# Patient Record
Sex: Female | Born: 1960
Health system: Southern US, Community
[De-identification: ages and names within clinical notes are randomized; demographics above are authoritative.]

## PROBLEM LIST (undated history)

## (undated) HISTORY — PX: ABDOMINAL HYSTERECTOMY: SUR658

---

## 2015-08-26 ENCOUNTER — Other Ambulatory Visit: Payer: Self-pay

## 2015-08-26 ENCOUNTER — Telehealth: Payer: Self-pay

## 2015-08-26 ENCOUNTER — Encounter: Payer: Self-pay | Admitting: Neurology

## 2015-08-26 ENCOUNTER — Ambulatory Visit (INDEPENDENT_AMBULATORY_CARE_PROVIDER_SITE_OTHER): Payer: BLUE CROSS/BLUE SHIELD | Admitting: Neurology

## 2015-08-26 VITALS — BP 122/70 | HR 85 | Ht 62.0 in | Wt 123.0 lb

## 2015-08-26 DIAGNOSIS — R569 Unspecified convulsions: Secondary | ICD-10-CM | POA: Diagnosis not present

## 2015-08-26 NOTE — Patient Instructions (Signed)
The pseudoseizures you described do not sound like epileptic seizures, so I am certain those are not seizures.  The twitching does not appear to be seizures either.  However, I would like to hook you up to an EEG to measure your brain waves and capture a twitch to see if the EEG shows seizure activity.  If it doesn't, then I would stay that the twitching is related to stress as well.  Appropriate therapy would require therapy with a psychologist as well as antidepressant medications.  We will contact you with EEG results and whether follow up is necessary.  Plan is to do a routine EEG followed up a 24 hour ambulatory EEG ([facility] just the 24 hour ambulatory EEG).

## 2015-08-26 NOTE — Progress Notes (Signed)
NEUROLOGY CONSULTATION NOTE  Alexandra Owens MRN: [id number] DOB: Sep 05, 1960  Referring provider: Dr. Yetta Flock Primary care provider: Dr. Yetta Flock  Reason for consult:  pseudoseizures  HISTORY OF PRESENT ILLNESS: Alexandra Owens is a 55 year old right-handed female with anxiety and established diagnosis of pseudoseizures who presents for muscle twitching.  History obtained by patient and PCP note.  In January, she developed significant work-related stress.  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  When she started, she unexpectedly learned that her division was understaffed, which caused significant stress.  She started to experience spells in which she develops generalized convulsions and flails but without loss of consciousness or awareness.  It lasts 10 to 15 minutes.  There is no incontinence or tongue biting.  There is no postictal confusion.  It was observed by her PCP who diagnosed her with pseudoseizures and she was started onTopamax  twice daily and Lexapro  daily.  These spells stopped in February.  In March, she began experiencing body jerks.  When she lays in bed, it involves the entire body.  If she is sitting up, it involves the shoulder.  It lasts just a second and occurs several times a day.  She has since resigned from her job due to the stress.  She has no history of seizures, head trauma, meningitis or complicated birth.  There is no family history of seizures.  PAST MEDICAL HISTORY: No past medical history on file.  PAST SURGICAL HISTORY: Past Surgical History  Procedure Laterality Date  . Abdominal hysterectomy      MEDICATIONS: No current outpatient prescriptions on file prior to visit.   No current facility-administered medications on file prior to visit.    ALLERGIES: Allergies  Allergen Reactions  . Sulfa Antibiotics Hives    FAMILY HISTORY: No family history on file.  SOCIAL HISTORY: Social History   Social History  . Marital  Status: Unknown    Spouse Name: N/A  . Number of Children: N/A  . Years of Education: N/A   Occupational History  . Not on file.   Social History Main Topics  . Smoking status: Never Smoker   . Smokeless tobacco: Not on file  . Alcohol Use: Not on file  . Drug Use: Not on file  . Sexual Activity: Not on file   Other Topics Concern  . Not on file   Social History Narrative  . No narrative on file    REVIEW OF SYSTEMS: Constitutional: No fevers, chills, or sweats, no generalized fatigue, change in appetite Eyes: No visual changes, double vision, eye pain Ear, nose and throat: No hearing loss, ear pain, nasal congestion, sore throat Cardiovascular: No chest pain, palpitations Respiratory:  No shortness of breath at rest or with exertion, wheezes GastrointestinaI: No nausea, vomiting, diarrhea, abdominal pain, fecal incontinence Genitourinary:  No dysuria, urinary retention or frequency Musculoskeletal:  No neck pain, back pain Integumentary: No rash, pruritus, skin lesions Neurological: as above Psychiatric: No depression, insomnia, anxiety Endocrine: No palpitations, fatigue, diaphoresis, mood swings, change in appetite, change in weight, increased thirst Hematologic/Lymphatic:  No anemia, purpura, petechiae. Allergic/Immunologic: no itchy/runny eyes, nasal congestion, recent allergic reactions, rashes  PHYSICAL EXAM: Filed Vitals:   08/26/15 0914  BP: 122/70  Pulse: 85   General: No acute distress.  Patient appears well-groomed.  Head:  Normocephalic/atraumatic Eyes:  fundi examined but not visualized Neck: supple, no paraspinal tenderness, full range of motion Back: No paraspinal tenderness Heart: regular rate and rhythm Lungs:  Clear to auscultation bilaterally. Vascular: No carotid bruits. Neurological Exam: Mental status: alert and oriented to person, place, and time, recent and remote memory intact, fund of knowledge intact, attention and concentration intact,  speech fluent and not dysarthric, language intact. Cranial nerves: CN I: not tested CN II: pupils equal, round and reactive to light, visual fields intact CN III, IV, VI:  full range of motion, no nystagmus, no ptosis CN V: facial sensation intact CN VII: upper and lower face symmetric CN VIII: hearing intact CN IX, X: gag intact, uvula midline CN XI: sternocleidomastoid and trapezius muscles intact CN XII: tongue midline Bulk & Tone: normal, no fasciculations. Motor:  5/5 throughout  Sensation:  temperature and vibration sensation intact. Deep Tendon Reflexes:  2+ throughout, toes downgoing.  Finger to nose testing:  Without dysmetria.  Heel to shin:  Without dysmetria.  Gait:  Normal station and stride.  Able to turn and tandem walk. Romberg negative. Her "jerks" were personally observed.  IMPRESSION: Non-epileptic spells.  Based on semiology, the "pseudoseizures" do sound non-epileptic.  The jerking spells, which were personally observed, also do not seem physiologic.  PLAN: 1.  I would like to obtain 24 hour ambulatory EEG to capture a spell.  If there is evidence of seizure activity, I would get an MRI of the brain and increase her topamax.  If there is no electrographic correlate, then management with psychology and psychiatry is recommended.  45 minutes spent face to face with patient, over 50% spent discussing diagnosis and plan.  Thank you for allowing me to take part in the care of this patient.  Shon MilletAdam Efosa Treichler, DO  CC:  Abner GreenspanBeth Hodges, MD

## 2015-08-26 NOTE — Progress Notes (Signed)
Chart forwarded.  

## 2015-08-26 NOTE — Telephone Encounter (Signed)
Orders placed for EEG and staff message sent to have pt schedule.

## 2015-08-27 ENCOUNTER — Ambulatory Visit (INDEPENDENT_AMBULATORY_CARE_PROVIDER_SITE_OTHER): Payer: BLUE CROSS/BLUE SHIELD | Admitting: Neurology

## 2015-08-27 DIAGNOSIS — R569 Unspecified convulsions: Secondary | ICD-10-CM | POA: Diagnosis not present

## 2015-08-28 ENCOUNTER — Telehealth: Payer: Self-pay | Admitting: Neurology

## 2015-08-28 ENCOUNTER — Telehealth: Payer: Self-pay

## 2015-08-28 DIAGNOSIS — IMO0001 Reserved for inherently not codable concepts without codable children: Secondary | ICD-10-CM

## 2015-08-28 NOTE — Telephone Encounter (Signed)
-----   Message from Alexandra DallasAdam R Jaffe, DO sent at 08/28/2015  7:10 AM EDT ----- EEG normal.  I want to order 24 hour ambulatory EEG to try and capture a jerking episode.

## 2015-08-28 NOTE — Procedures (Signed)
ELECTROENCEPHALOGRAM REPORT  Date of Study: 08/27/2015  Patient's Name: Alexandra Owens MRN: [id number] Date of Birth: Jul 01, 1960  Indication: 55 year old female with presumed pseudoseizures, experiencing intermittent body jerks.  Medications: Topiramate Escitalopram   Technical Summary: This is a multichannel digital EEG recording, using the international 10-20 placement system with electrodes applied with paste and impedances below 5000 ohms.    Description: The EEG background is symmetric, with a well-developed posterior dominant rhythm of 10-11 Hz, which is reactive to eye opening and closing.  Diffuse beta activity is seen, with a bilateral frontal preponderance.  No focal or generalized abnormalities are seen.  No focal or generalized epileptiform discharges are seen.  Stage II sleep is seen, with normal and symmetric sleep patterns.  Hyperventilation and photic stimulation were performed, and produced no abnormalities.  ECG revealed normal cardiac rate and rhythm.  Impression: This is a normal routine EEG of the awake and asleep states, with activating procedures.  A normal study does not rule out the possibility of a seizure disorder in this patient.  Boyce Keltner R. Everlena CooperJaffe, DO

## 2015-08-28 NOTE — Telephone Encounter (Signed)
Chart forwarded.  

## 2015-08-28 NOTE — Telephone Encounter (Signed)
Alexandra Owens Jan 02, 2061. Her PCP office (Dr. Yetta FlockHodges) called needing notes faxed from her appointment on 08/27/15 and from her upcoming appointment on 09/03/15. The fax number is [phone]. Thank you

## 2015-08-28 NOTE — Telephone Encounter (Signed)
Pt aware. She has a amb EEG appointment next week.

## 2015-09-03 ENCOUNTER — Ambulatory Visit (INDEPENDENT_AMBULATORY_CARE_PROVIDER_SITE_OTHER): Payer: BLUE CROSS/BLUE SHIELD | Admitting: Neurology

## 2015-09-03 DIAGNOSIS — R569 Unspecified convulsions: Secondary | ICD-10-CM

## 2015-09-04 ENCOUNTER — Telehealth: Payer: Self-pay

## 2015-09-04 NOTE — Telephone Encounter (Signed)
Message relayed to patient. Verbalized understanding and denied questions.   

## 2015-09-04 NOTE — Procedures (Signed)
24 HOUR AMBULATORY ELECTROENCEPHALOGRAM REPORT  Date of Study: 09/03/2015 to 09/04/2015  Patient's Name: Alexandra Owens MRN: [id number] Date of Birth: [birth year]  Indication: Body twitching  Medications: topiramate escitalopram  Technical Summary: This is a multichannel digital EEG recording, using the international 10-20 placement system with electrodes applied with paste and impedances below 5000 ohms.    Description: The EEG background is symmetric, with a well-developed posterior dominant rhythm of 12 Hz, which is reactive to eye opening and closing.  Diffuse beta activity is seen, with a bilateral frontal preponderance.  No focal or generalized abnormalities are seen.  No focal or generalized epileptiform discharges are seen.  The patient had several habitual spells with no electrographic correlate.  Stage II sleep is seen, with normal and symmetric sleep patterns.  Hyperventilation and photic stimulation were not perfomed.  ECG revealed normal cardiac rate and rhythm.  Impression: This is a normal 24 hour ambulatory EEG.  Several of the patient's habitual spells were captured without electrographic correlate, indicating that these are non-epileptic events.  Adam R. Everlena CooperJaffe, DO

## 2015-09-04 NOTE — Telephone Encounter (Signed)
-----   Message from Drema DallasAdam R Jaffe, DO sent at 09/04/2015 12:19 PM EDT ----- I reviewed the EEG and saw her twitches.  There is no correlating seizures.  The twitching is not neurologic.  It is likely a symptom of underlying stress

## 2017-04-30 DIAGNOSIS — J019 Acute sinusitis, unspecified: Secondary | ICD-10-CM | POA: Diagnosis not present

## 2017-12-15 DIAGNOSIS — Z131 Encounter for screening for diabetes mellitus: Secondary | ICD-10-CM | POA: Diagnosis not present

## 2017-12-15 DIAGNOSIS — Z13220 Encounter for screening for lipoid disorders: Secondary | ICD-10-CM | POA: Diagnosis not present

## 2017-12-15 DIAGNOSIS — Z136 Encounter for screening for cardiovascular disorders: Secondary | ICD-10-CM | POA: Diagnosis not present

## 2017-12-15 DIAGNOSIS — Z0130 Encounter for examination of blood pressure without abnormal findings: Secondary | ICD-10-CM | POA: Diagnosis not present

## 2017-12-15 DIAGNOSIS — Z713 Dietary counseling and surveillance: Secondary | ICD-10-CM | POA: Diagnosis not present

## 2018-02-02 DIAGNOSIS — Z1239 Encounter for other screening for malignant neoplasm of breast: Secondary | ICD-10-CM | POA: Diagnosis not present

## 2018-02-02 DIAGNOSIS — Z1231 Encounter for screening mammogram for malignant neoplasm of breast: Secondary | ICD-10-CM | POA: Diagnosis not present

## 2018-05-05 ENCOUNTER — Encounter (HOSPITAL_COMMUNITY): Payer: Self-pay | Admitting: *Deleted

## 2018-05-05 ENCOUNTER — Emergency Department (HOSPITAL_COMMUNITY)
Admission: EM | Admit: 2018-05-05 | Discharge: 2018-05-05 | Disposition: A | Discharge status: Home / Self Care | Payer: Commercial Managed Care - PPO | Attending: Emergency Medicine | Admitting: Emergency Medicine

## 2018-05-05 ENCOUNTER — Other Ambulatory Visit: Payer: Self-pay

## 2018-05-05 ENCOUNTER — Emergency Department (HOSPITAL_COMMUNITY): Payer: Commercial Managed Care - PPO

## 2018-05-05 DIAGNOSIS — R109 Unspecified abdominal pain: Secondary | ICD-10-CM

## 2018-05-05 DIAGNOSIS — K5792 Diverticulitis of intestine, part unspecified, without perforation or abscess without bleeding: Secondary | ICD-10-CM | POA: Diagnosis not present

## 2018-05-05 DIAGNOSIS — R1011 Right upper quadrant pain: Secondary | ICD-10-CM | POA: Diagnosis present

## 2018-05-05 DIAGNOSIS — K5732 Diverticulitis of large intestine without perforation or abscess without bleeding: Secondary | ICD-10-CM | POA: Insufficient documentation

## 2018-05-05 LAB — URINALYSIS, ROUTINE W REFLEX MICROSCOPIC
Bacteria, UA: NONE SEEN
Bilirubin Urine: NEGATIVE
Glucose, UA: NEGATIVE mg/dL
Ketones, ur: NEGATIVE mg/dL
Leukocytes, UA: NEGATIVE
Nitrite: NEGATIVE
PROTEIN: NEGATIVE mg/dL
SPECIFIC GRAVITY, URINE: 1.006 (ref 1.005–1.030)
pH: 6 (ref 5.0–8.0)

## 2018-05-05 MED ORDER — AMOXICILLIN-POT CLAVULANATE 875-125 MG PO TABS
1.0000 | ORAL_TABLET | Freq: Once | ORAL | Status: AC
  Administered 2018-05-05: 1 via ORAL
  Filled 2018-05-05: qty 1

## 2018-05-05 MED ORDER — AMOXICILLIN-POT CLAVULANATE 875-125 MG PO TABS: 1.0000 | ORAL_TABLET | Freq: Two times a day (BID) | ORAL | 0 refills | Status: AC

## 2018-05-05 NOTE — ED Triage Notes (Addendum)
Pt stated "I woke up with pain in my side but it's been going on for a few days but not all the time. Nausea just started."  Pt indicates pain to right flank.  Denies dysuria.

## 2018-05-05 NOTE — ED Provider Notes (Signed)
[facility]-EMERGENCY DEPT Provider Note: Lowella Dell, MD, FACEP  CSN: [id number] MRN: [id number] ARRIVAL: 05/05/18 at 0423 ROOM: [facility]   CHIEF COMPLAINT  Flank Pain (right)   HISTORY OF PRESENT ILLNESS  05/05/18 4:46 AM Alexandra Owens is a 58 y.o. female with a 2-day history of pain in her right flank.  She describes the pain as a pressing pain.  There seems to be some exacerbation with movement and position.  She rates it as a 5 out of 10 currently.  The pain has been waxing and waning.  She has had some nausea with it but no vomiting.  She has had no urinary or bowel changes.  She is also had some right upper quadrant pain about 45 minutes after eating but this seems unrelated to the flank pain.   History reviewed. No pertinent past medical history.  Past Surgical History:  Procedure Laterality Date  . ABDOMINAL HYSTERECTOMY      No family history on file.  Social History   Tobacco Use  . Smoking status: Never Smoker  Substance Use Topics  . Alcohol use: Yes    Alcohol/week: 0.0 standard drinks    Comment: rarely  . Drug use: Never    Prior to Admission medications   Not on File    Allergies Sulfa antibiotics   REVIEW OF SYSTEMS  Negative except as noted here or in the History of Present Illness.   PHYSICAL EXAMINATION  Initial Vital Signs Blood pressure (!) 146/98, pulse 86, temperature 98.3 F (36.8 C), temperature source Oral, resp. rate 14, height 5\' 1"  (1.549 m), weight 72.6 kg, SpO2 96 %.  Examination General: Well-developed, well-nourished female in no acute distress; appearance consistent with age of record HENT: normocephalic; atraumatic Eyes: pupils equal, round and reactive to light; extraocular muscles intact Neck: supple Heart: regular rate and rhythm Lungs: clear to auscultation bilaterally Abdomen: soft; nondistended; mild left lower quadrant tenderness; no masses or hepatosplenomegaly; bowel sounds present GU: Mild right CVA  tenderness Extremities: No deformity; full range of motion; pulses normal Neurologic: Awake, alert and oriented; motor function intact in all extremities and symmetric; no facial droop Skin: Warm and dry Psychiatric: Normal mood and affect   RESULTS  Summary of this visit's results, reviewed by myself:   EKG Interpretation  Date/Time:    Ventricular Rate:    PR Interval:    QRS Duration:   QT Interval:    QTC Calculation:   R Axis:     Text Interpretation:        Laboratory Studies: Results for orders placed or performed during the hospital encounter of 05/05/18 (from the past 24 hour(s))  Urinalysis, Routine w reflex microscopic- may I&O cath if menses     Status: Abnormal   Collection Time: 05/05/18  4:43 AM  Result Value Ref Range   Color, Urine STRAW (A) YELLOW   APPearance CLEAR CLEAR   Specific Gravity, Urine 1.006 1.005 - 1.030   pH 6.0 5.0 - 8.0   Glucose, UA NEGATIVE NEGATIVE mg/dL   Hgb urine dipstick SMALL (A) NEGATIVE   Bilirubin Urine NEGATIVE NEGATIVE   Ketones, ur NEGATIVE NEGATIVE mg/dL   Protein, ur NEGATIVE NEGATIVE mg/dL   Nitrite NEGATIVE NEGATIVE   Leukocytes, UA NEGATIVE NEGATIVE   RBC / HPF 0-5 0 - 5 RBC/hpf   WBC, UA 0-5 0 - 5 WBC/hpf   Bacteria, UA NONE SEEN NONE SEEN   Squamous Epithelial / LPF 0-5 0 - 5   Imaging Studies:  Ct Renal Stone Study  Result Date: 05/05/2018 CLINICAL DATA:  Right flank pain with stone disease suspected EXAM: CT ABDOMEN AND PELVIS WITHOUT CONTRAST TECHNIQUE: Multidetector CT imaging of the abdomen and pelvis was performed following the standard protocol without IV contrast. COMPARISON:  None. FINDINGS: Lower chest:  No contributory findings. Hepatobiliary: Scattered hepatic cysts.No evidence of biliary obstruction or stone. Pancreas: Unremarkable. Spleen: Unremarkable. Adrenals/Urinary Tract: Negative adrenals. No hydronephrosis or stone. Unremarkable bladder. Stomach/Bowel: Mild stranding at the descending sigmoid  junction where there are numerous colonic diverticula. No abscess or pneumoperitoneum. No appendicitis. No bowel obstruction. Vascular/Lymphatic: No acute vascular abnormality. No mass or adenopathy. Reproductive:Hysterectomy Other: No ascites or pneumoperitoneum. Musculoskeletal: No acute abnormalities. IMPRESSION: 1. No hydronephrosis, urolithiasis, or appendicitis. No explanation for history of right-sided pain. 2. Mild diverticulitis at the descending sigmoid junction. Electronically Signed   By: Marnee Spring M.D.   On: 05/05/2018 05:58    ED COURSE and MDM  Nursing notes and initial vitals signs, including pulse oximetry, reviewed.  Vitals:   05/05/18 0433  BP: (!) 146/98  Pulse: 86  Resp: 14  Temp: 98.3 F (36.8 C)  TempSrc: Oral  SpO2: 96%  Weight: 72.6 kg  Height: 5\' 1"  (1.549 m)   6:05 AM We will treat patient for mild diverticulitis.  She declines analgesics.  I suspect her right flank pain is musculoskeletal or radicular in nature.  She was advised the right upper quadrant pain may represent gallbladder disease and she should follow-up with her primary care physician should symptoms persist.  PROCEDURES    ED DIAGNOSES     ICD-10-CM   1. Sigmoid diverticulitis K57.32   2. Right flank pain R10.9        Irene Mitcham, Jonny Ruiz, MD 05/05/18 [phone]

## 2018-07-05 ENCOUNTER — Emergency Department (HOSPITAL_BASED_OUTPATIENT_CLINIC_OR_DEPARTMENT_OTHER)
Admission: EM | Admit: 2018-07-05 | Discharge: 2018-07-05 | Disposition: A | Discharge status: Home / Self Care | Payer: Commercial Managed Care - PPO | Attending: Emergency Medicine | Admitting: Emergency Medicine

## 2018-07-05 ENCOUNTER — Encounter (HOSPITAL_BASED_OUTPATIENT_CLINIC_OR_DEPARTMENT_OTHER): Payer: Self-pay

## 2018-07-05 ENCOUNTER — Other Ambulatory Visit: Payer: Self-pay

## 2018-07-05 ENCOUNTER — Emergency Department (HOSPITAL_BASED_OUTPATIENT_CLINIC_OR_DEPARTMENT_OTHER): Payer: Commercial Managed Care - PPO

## 2018-07-05 DIAGNOSIS — R079 Chest pain, unspecified: Secondary | ICD-10-CM | POA: Insufficient documentation

## 2018-07-05 DIAGNOSIS — M79662 Pain in left lower leg: Secondary | ICD-10-CM | POA: Diagnosis present

## 2018-07-05 LAB — COMPREHENSIVE METABOLIC PANEL
ALT: 16 U/L (ref 0–44)
ANION GAP: 7 (ref 5–15)
AST: 20 U/L (ref 15–41)
Albumin: 4.4 g/dL (ref 3.5–5.0)
Alkaline Phosphatase: 58 U/L (ref 38–126)
BUN: 15 mg/dL (ref 6–20)
CO2: 27 mmol/L (ref 22–32)
Calcium: 9.1 mg/dL (ref 8.9–10.3)
Chloride: 104 mmol/L (ref 98–111)
Creatinine, Ser: 0.89 mg/dL (ref 0.44–1.00)
GFR calc Af Amer: >60 mL/min (ref >60)
GFR calc non Af Amer: >60 mL/min (ref >60)
Glucose, Bld: 95 mg/dL (ref 70–99)
Potassium: 3.7 mmol/L (ref 3.5–5.1)
Sodium: 138 mmol/L (ref 135–145)
Total Bilirubin: 0.6 mg/dL (ref 0.3–1.2)
Total Protein: 7.7 g/dL (ref 6.5–8.1)

## 2018-07-05 LAB — CBC WITH DIFFERENTIAL/PLATELET
Abs Immature Granulocytes: 0 10*3/uL (ref 0.00–0.07)
Basophils Absolute: 0 10*3/uL (ref 0.0–0.1)
Basophils Relative: 1 %
Eosinophils Absolute: 0.2 10*3/uL (ref 0.0–0.5)
Eosinophils Relative: 3 %
HCT: 40.8 % (ref 36.0–46.0)
Hemoglobin: 12.6 g/dL (ref 12.0–15.0)
Immature Granulocytes: 0 %
Lymphocytes Relative: 34 %
Lymphs Abs: 2.4 10*3/uL (ref 0.7–4.0)
MCH: 26.1 pg (ref 26.0–34.0)
MCHC: 30.9 g/dL (ref 30.0–36.0)
MCV: 84.6 fL (ref 80.0–100.0)
Monocytes Absolute: 0.5 10*3/uL (ref 0.1–1.0)
Monocytes Relative: 7 %
Neutro Abs: 3.9 10*3/uL (ref 1.7–7.7)
Neutrophils Relative %: 55 %
Platelets: 239 10*3/uL (ref 150–400)
RBC: 4.82 MIL/uL (ref 3.87–5.11)
RDW: 14.9 % (ref 11.5–15.5)
WBC: 6.9 10*3/uL (ref 4.0–10.5)
nRBC: 0 % (ref 0.0–0.2)

## 2018-07-05 LAB — D-DIMER, QUANTITATIVE: D-Dimer, Quant: 0.46 ug/mL-FEU (ref 0.00–0.50)

## 2018-07-05 LAB — TROPONIN I: Troponin I: <0.03 ng/mL (ref <0.03)

## 2018-07-05 MED ORDER — ACETAMINOPHEN 500 MG PO TABS
1000.0000 mg | ORAL_TABLET | Freq: Once | ORAL | Status: AC
  Administered 2018-07-05: 1000 mg via ORAL
  Filled 2018-07-05: qty 2

## 2018-07-05 MED ORDER — KETOROLAC TROMETHAMINE 15 MG/ML IJ SOLN
15.0000 mg | Freq: Once | INTRAMUSCULAR | Status: AC
  Administered 2018-07-05: 15 mg via INTRAVENOUS
  Filled 2018-07-05: qty 1

## 2018-07-05 NOTE — Discharge Instructions (Addendum)
Your ultrasound shows concern for possible ruptured Baker's cyst.  I would treat this symptomatically with cold compresses.  Make sure you are elevating the area.  Use the Ace wrap for compression.  Use the crutches for limited weightbearing in the next several days.  Have this reevaluated by your primary care doctor for repeat ultrasound if indicated.  In terms of your chest pain work-up today has been reassuring.  No findings to explain your chest pain.  May related to your acid reflux.  Again our work-up rules out emergency such as blood clot or heart attack however we do recommend you following up with your primary care doctor for further work-up for ongoing chest pain with possible stress test.  Return to ED if you develop any worsening symptoms or any signs of redness or drainage from your left leg, or fever

## 2018-07-05 NOTE — ED Notes (Signed)
ED Provider at bedside. 

## 2018-07-05 NOTE — ED Triage Notes (Signed)
Pt c/o left calf pain and tightness since last night with some swelling into left foot. Pt denies any recent travel, denies BCP use, denies smoking.

## 2018-07-05 NOTE — ED Provider Notes (Signed)
[facility] EMERGENCY DEPARTMENT Provider Note   CSN: [id number] Arrival date & time: 07/05/18  1635    History   Chief Complaint Chief Complaint  Patient presents with   Leg Pain    HPI Alexandra Owens is a 58 y.o. female.     HPI 58 year old female with no pertinent past medical history presents to the emergency department today for evaluation of left calf pain.  Patient states that last night she got a "severe cramp in her left calf".  She thought that it would go away however it has persisted.  She rates the pain a 9/10.  Worse with palpation and range of motion.  Able to ambulate but this does cause her pain.  She tried massaging without any relief.  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  Upon further questioning patient does endorse intermittent chest pain.  She describes it as dull ache at times across her anterior chest.  She states this will come and go.  Last for several seconds.  Not exertional.  Not pleuritic in nature.  No associated shortness of breath.  Ongoing for 2 to 3 months.  Saw her primary care doctor concerning this and states that her primary care doctor did not feel that she was having a heart attack and thought that may be related to acid reflux.  Patient denies any chest pain at this time.  No history of PE/DVT, prolonged immobilization, recent hospitalization/surgeries, unilateral leg swelling or calf tenderness, hemoptysis, hormone use.  Denies any tobacco use.  No family history of PEs or clotting disorders.  No family history of significant cardiac disease.  Patient denies any cardiac disease herself.  She takes no medications for her symptoms.  She did take ibuprofen yesterday for her pain with some relief.  Has not taken any medications today for pain. History reviewed. No pertinent past medical history.  There are no active problems to display for this  patient.   Past Surgical History:  Procedure Laterality Date   ABDOMINAL HYSTERECTOMY       OB History   No obstetric history on file.      Home Medications    Prior to Admission medications   Medication Sig Start Date End Date Taking? Authorizing Provider  amoxicillin-clavulanate (AUGMENTIN) 875-125 MG tablet Take 1 tablet by mouth 2 (two) times daily. One po bid x 7 days 05/05/18   Molpus, John, MD    Family History No family history on file.  Social History Social History   Tobacco Use   Smoking status: Never Smoker  Substance Use Topics   Alcohol use: Yes    Alcohol/week: 0.0 standard drinks    Comment: rarely   Drug use: Never     Allergies   Sulfa antibiotics   Review of Systems Review of Systems  Constitutional: Negative for chills and fever.  HENT: Negative for congestion.   Eyes: Negative for discharge.  Respiratory: Negative for cough, shortness of breath and wheezing.   Cardiovascular: Positive for chest pain and leg swelling. Negative for palpitations.  Gastrointestinal: Negative for abdominal pain, diarrhea, nausea and vomiting.  Genitourinary: Negative for hematuria.  Musculoskeletal: Negative for myalgias.  Skin: Negative for rash.  Neurological: Negative for headaches.     Physical Exam Updated Vital Signs BP 137/84 (BP Location: Left Arm)    Pulse 90    Temp 99 F (37.2 C) (  Oral)    Resp 16    Ht  (1.549 m)    Wt 72.6 kg    SpO2 98%    BMI 30.23 kg/m   Physical Exam Vitals signs and nursing note reviewed.  Constitutional:      General: She is not in acute distress.    Appearance: She is well-developed. She is not toxic-appearing or diaphoretic.  HENT:     Head: Normocephalic and atraumatic.     Nose: Nose normal.     Mouth/Throat:     Mouth: Mucous membranes are moist.  Eyes:     General:        Right eye: No discharge.        Left eye: No discharge.     Conjunctiva/sclera: Conjunctivae normal.     Pupils: Pupils  are equal, round, and reactive to light.  Neck:     Musculoskeletal: Normal range of motion and neck supple.     Vascular: No JVD.     Trachea: No tracheal deviation.  Cardiovascular:     Rate and Rhythm: Normal rate and regular rhythm.     Pulses: Normal pulses.     Heart sounds: Normal heart sounds. No murmur. No friction rub. No gallop.   Pulmonary:     Effort: Pulmonary effort is normal. No respiratory distress.     Breath sounds: Normal breath sounds. No stridor. No wheezing, rhonchi or rales.  Chest:     Chest wall: No tenderness.  Abdominal:     General: Bowel sounds are normal. There is no distension.     Palpations: Abdomen is soft.     Tenderness: There is no abdominal tenderness. There is no guarding or rebound.  Musculoskeletal: Normal range of motion.       Legs:     Comments: Minimal edema to the left calf.  Tender to palpation.  No palpable cord.  No erythema, induration, fluctuance noted.  Minimal warmth noted to the left calf.  Pain extends to the left popliteal fossa.  Skin compartments are soft.  DP pulses 2+ bilaterally.  Sensation intact.  Brisk cap refill.  Full range of motion of all joints the left lower leg.  Lymphadenopathy:     Cervical: No cervical adenopathy.  Skin:    General: Skin is warm and dry.     Capillary Refill: Capillary refill takes less than 2 seconds.  Neurological:     Mental Status: She is alert and oriented to person, place, and time.     Sensory: No sensory deficit.  Psychiatric:        Behavior: Behavior normal.        Thought Content: Thought content normal.        Judgment: Judgment normal.      ED Treatments / Results  Labs (all labs ordered are listed, but only abnormal results are displayed) Labs Reviewed  CBC WITH DIFFERENTIAL/PLATELET  COMPREHENSIVE METABOLIC PANEL  TROPONIN I  D-DIMER, QUANTITATIVE (NOT AT [facility])    EKG EKG Interpretation  Date/Time:  Wednesday July 05 2018 19:59:02 EDT Ventricular Rate:   76 PR Interval:    QRS Duration: 92 QT Interval:  392 QTC Calculation: 441 R Axis:   20 Text Interpretation:  Sinus rhythm Low voltage, precordial leads No old tracing to compare Confirmed by Linwood Dibbles [phone]) on 07/05/2018 8:16:08 PM   Radiology Dg Chest 2 View  Result Date: 07/05/2018 CLINICAL DATA:  Initial evaluation for acute chest pain. EXAM: CHEST - 2  VIEW COMPARISON:  None available. FINDINGS: The cardiac and mediastinal silhouettes are within normal limits. The lungs are normally inflated. No airspace consolidation, pleural effusion, or pulmonary edema is identified. There is no pneumothorax. No acute osseous abnormality identified. IMPRESSION: No ac radiographic evidence for active cardiopulmonary disease. Electronically Signed   By: Rise Mu M.D.   On: 07/05/2018 19:29   US Venous Img Lower Unilateral Left  Result Date: 07/05/2018 CLINICAL DATA:  Initial evaluation for acute left calf pain, swelling. EXAM: Left LOWER EXTREMITY VENOUS DOPPLER ULTRASOUND TECHNIQUE: Gray-scale sonography with graded compression, as well as color Doppler and duplex ultrasound were performed to evaluate the lower extremity deep venous systems from the level of the common femoral vein and including the common femoral, femoral, profunda femoral, popliteal and calf veins including the posterior tibial, peroneal and gastrocnemius veins when visible. The superficial great saphenous vein was also interrogated. Spectral Doppler was utilized to evaluate flow at rest and with distal augmentation maneuvers in the common femoral, femoral and popliteal veins. COMPARISON:  None. FINDINGS: Contralateral Common Femoral Vein: Respiratory phasicity is normal and symmetric with the symptomatic side. No evidence of thrombus. Normal compressibility. Common Femoral Vein: No evidence of thrombus. Normal compressibility, respiratory phasicity and response to augmentation. Saphenofemoral Junction: No evidence of thrombus.  Normal compressibility and flow on color Doppler imaging. Profunda Femoral Vein: No evidence of thrombus. Normal compressibility and flow on color Doppler imaging. Femoral Vein: No evidence of thrombus. Normal compressibility, respiratory phasicity and response to augmentation. Popliteal Vein: No evidence of thrombus. Normal compressibility, respiratory phasicity and response to augmentation. Calf Veins: No evidence of thrombus. Normal compressibility and flow on color Doppler imaging. Superficial Great Saphenous Vein: No evidence of thrombus. Normal compressibility. Venous Reflux:  None. Other Findings: 3.8 x 0.7 x 4.6 cm mildly complex cystic lesion at the posterior left calf at site of pain. Lesion positioned within the calf musculature no internal vascularity or definite solid component. IMPRESSION: 1. No evidence of deep venous thrombosis. 2. 3.8 x 0.7 x 4.6 cm mildly complex collection positioned at site of pain in the left calf. Finding is nonspecific, and could reflect sequelae of a recently ruptured popliteal/Baker's cyst. Prior trauma with subacute hematoma formation or infection with abscess formation could also be considered in the correct clinical setting. Electronically Signed   By: Rise Mu M.D.   On: 07/05/2018 19:35    Procedures Procedures (including critical care time)  Medications Ordered in ED Medications  acetaminophen (TYLENOL) tablet 1,000 mg (1,000 mg Oral Given 07/05/18 1754)     Initial Impression / Assessment and Plan / ED Course  I have reviewed the triage vital signs and the nursing notes.  Pertinent labs & imaging results that were available during my care of the patient were reviewed by me and considered in my medical decision making (see chart for details).        58 year old female presents to the ED for evaluation of left calf pain.  She also reports some intermittent chest pain that she is being seen by her primary care doctor for this.  Patient  overall well-appearing and nontoxic on exam.  Vital signs are very reassuring.  Patient neurovascular intact in lower extremities.  No focal neurological deficit.  Heart regular rate and rhythm.  Lungs clear to auscultation bilaterally.  Labs are overall reassuring.  No leukocytosis.  Hemoglobin at baseline.  No significant electrolyte derangement.  Normal d-dimer.  Negative troponin.  Patient's symptoms have been intermittent for the  past 3 months.  Denies any pain at this time.  Patient's hear score is 1.  Patient seems atypical for ACS.  Given negative d-dimer low suspicion for PE and no indication for CTA of chest.  EKG performed shows normal sinus rhythm with a heart rate of 76 bpm.  Patient has some nonspecific T wave inversions in V1 but no reciprocal changes or signs of acute ST elevation or depression.  Ultrasound shows no signs of DVT.  Does note concern for collection likely ruptured Baker's cyst.  Patient has no erythema, fluctuance or wound to the back of her leg.  Low suspicion for abscess formation.  Patient afebrile.  Likely ruptured Baker's cyst causing pain.  Will treat symptomatically with rice therapy, NSAIDs and minimal weightbearing.  Patient to follow-up with her primary care doctor.  In terms of the chest pain.  Unknown etiology however have ruled out emergent conditions in the ED today.  Patient to follow back up with her primary care doctor for further testing with stress test if indicated.  Pt is hemodynamically stable, in NAD, & able to ambulate in the ED. Evaluation does not show pathology that would require ongoing emergent intervention or inpatient treatment. I explained the diagnosis to the patient. Pain has been managed & has no complaints prior to dc. Pt is comfortable with above plan and is stable for discharge at this time. All questions were answered prior to disposition. Strict return precautions for f/u to the ED were discussed. Encouraged follow up with PCP.   Final  Clinical Impressions(s) / ED Diagnoses   Final diagnoses:  Pain of left calf  Chest pain, unspecified type    ED Discharge Orders    None       Wallace Keller 07/05/18 2018    Linwood Dibbles, MD 07/09/18 1112

## 2018-07-17 DIAGNOSIS — M79662 Pain in left lower leg: Secondary | ICD-10-CM | POA: Diagnosis not present

## 2019-06-23 ENCOUNTER — Ambulatory Visit: Payer: Commercial Managed Care - PPO | Attending: Internal Medicine

## 2019-06-23 DIAGNOSIS — Z23 Encounter for immunization: Secondary | ICD-10-CM | POA: Insufficient documentation

## 2019-06-23 NOTE — Progress Notes (Signed)
   Covid-19 Vaccination Clinic  Name:  Cyril Railey    MRN: [id number] DOB: 20-Mar-1961  06/23/2019  Ms. Tilmon was observed post Covid-19 immunization for 15 minutes without incidence. She was provided with Vaccine Information Sheet and instruction to access the V-Safe system.   Ms. Vokes was instructed to call 911 with any severe reactions post vaccine: Marland Kitchen Difficulty breathing  . Swelling of your face and throat  . A fast heartbeat  . A bad rash all over your body  . Dizziness and weakness    Immunizations Administered    Name Date Dose VIS Date Route   Pfizer COVID-19 Vaccine 06/23/2019  5:24 PM 0.3 mL 04/06/2019 Intramuscular   Manufacturer: [employer], [employer]   Lot: [id number]   NDC: [address]-5

## 2019-07-14 ENCOUNTER — Ambulatory Visit: Payer: Commercial Managed Care - PPO | Attending: Internal Medicine

## 2019-07-14 DIAGNOSIS — Z23 Encounter for immunization: Secondary | ICD-10-CM

## 2019-07-14 NOTE — Progress Notes (Signed)
   Covid-19 Vaccination Clinic  Name:  Alexandra Owens    MRN: [id number] DOB: 11/02/1960  07/14/2019  [name] was observed post Covid-19 immunization for 15 minutes without incident. She was provided with Vaccine Information Sheet and instruction to access the V-Safe system.   [name] was instructed to call 911 with any severe reactions post vaccine: Marland Kitchen Difficulty breathing  . Swelling of face and throat  . A fast heartbeat  . A bad rash all over body  . Dizziness and weakness   Immunizations Administered    Name Date Dose VIS Date Route   Pfizer COVID-19 Vaccine 07/14/2019  9:17 AM 0.3 mL 04/06/2019 Intramuscular   Manufacturer: [employer], [employer]   Lot: [id number]   NDC: [address]-4

## 2020-05-02 IMAGING — CR CHEST - 2 VIEW
2 series · 2 of 2 positions shown · non-contrast
Comparison: None available.

CLINICAL DATA: Initial evaluation for acute chest pain.

EXAM:
CHEST - 2 VIEW

[w chest pa]
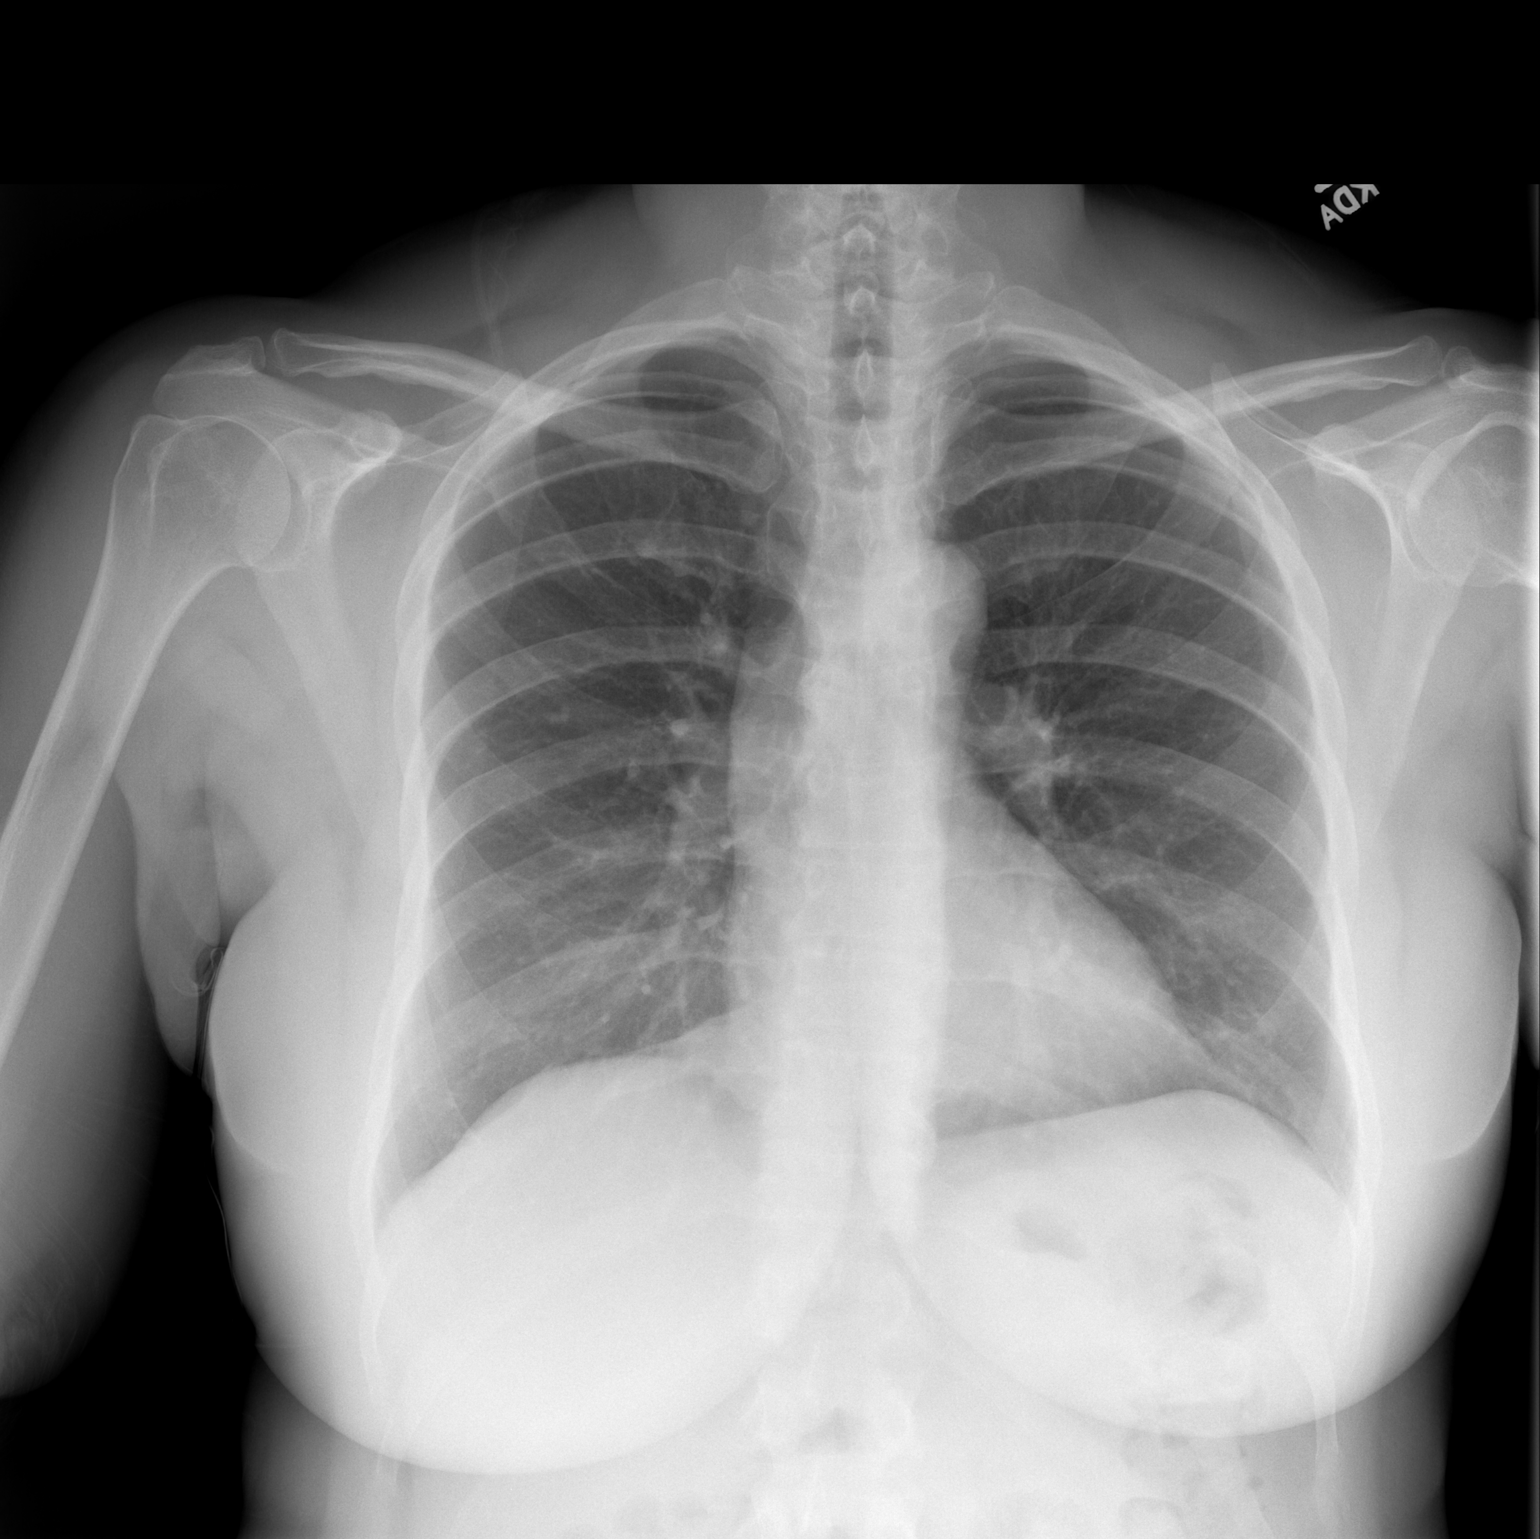

[w chest lat]
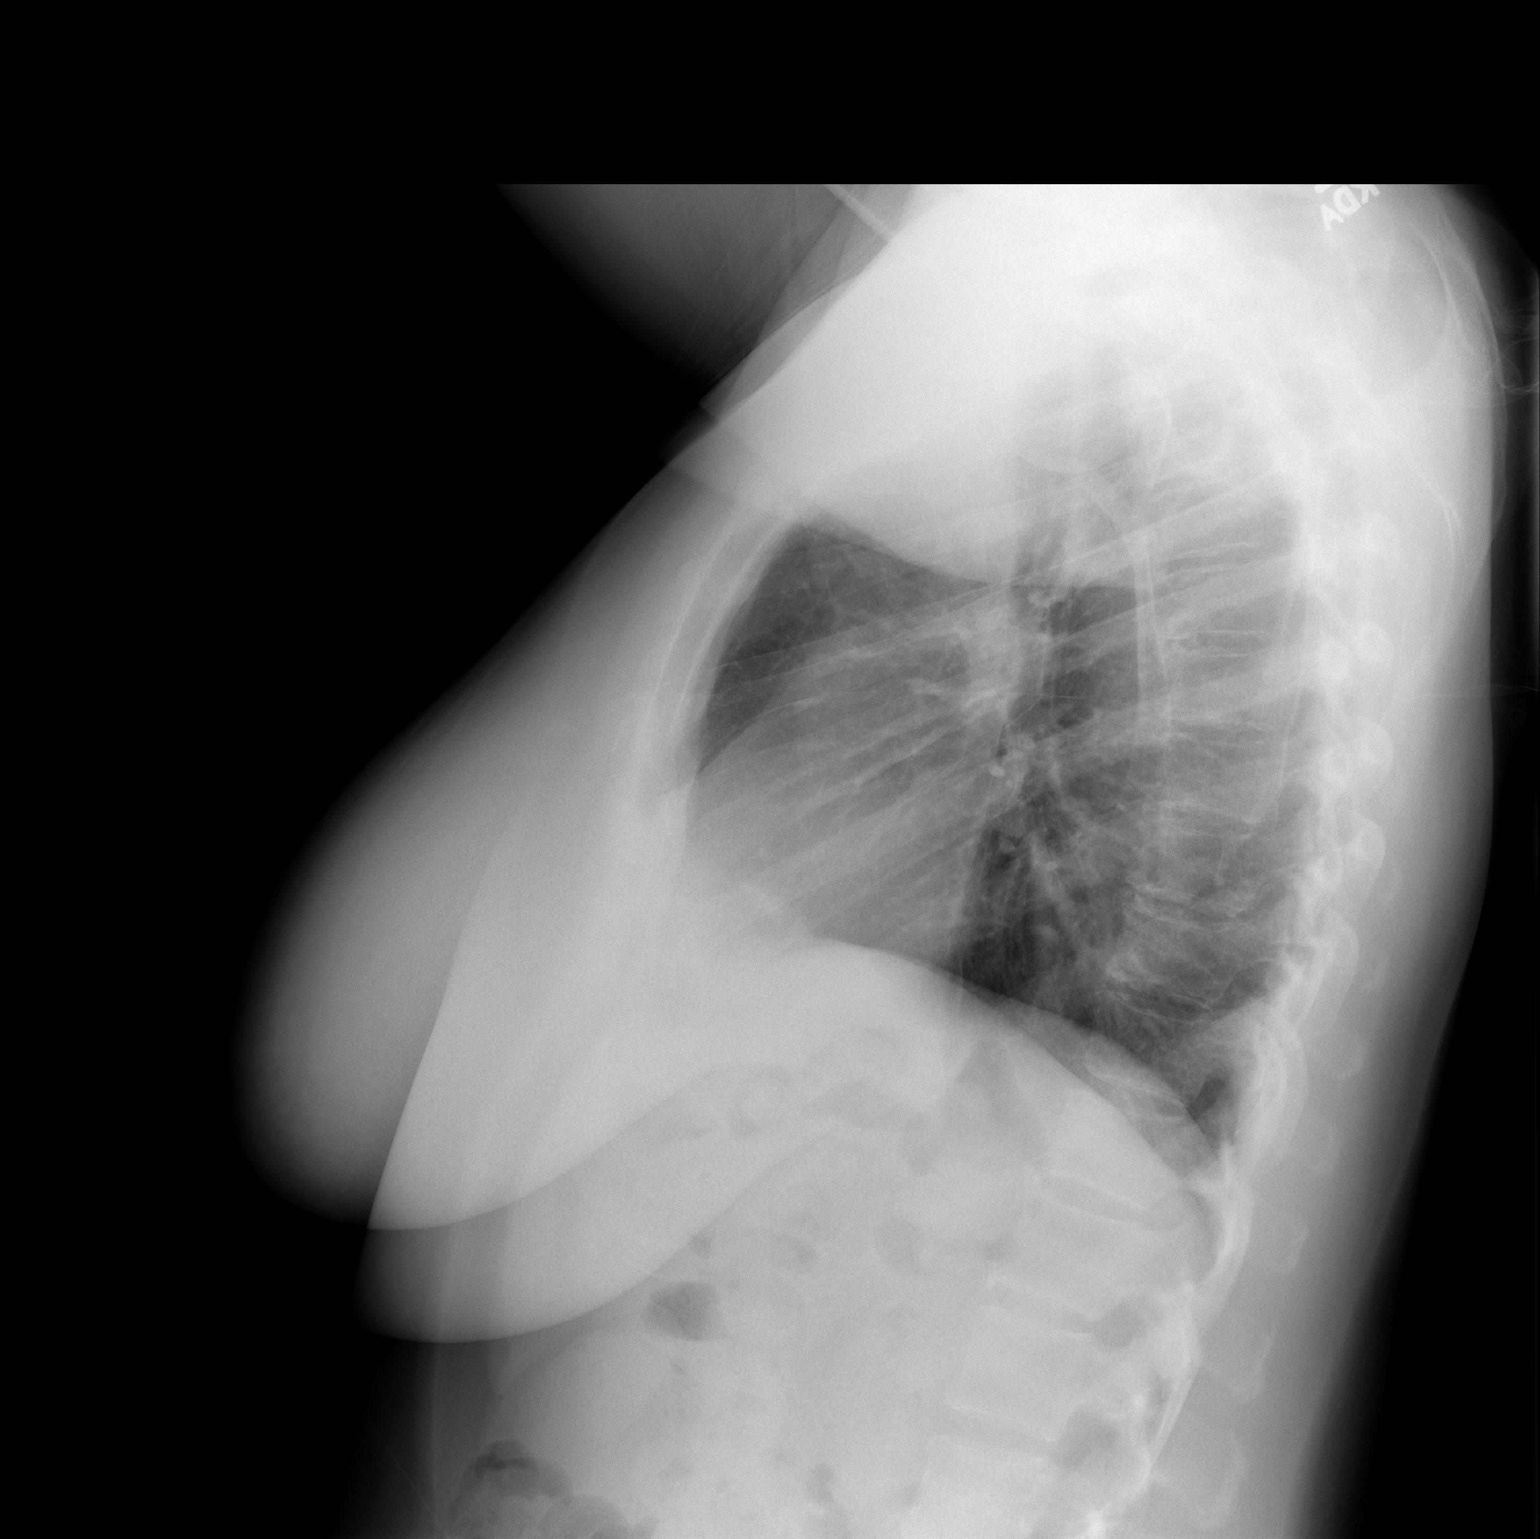

[2 of 2 positions shown; findings below may reference images not displayed]

FINDINGS: The cardiac and mediastinal silhouettes are within normal limits.

The lungs are normally inflated. No airspace consolidation, pleural
effusion, or pulmonary edema is identified. There is no
pneumothorax.

No acute osseous abnormality identified.
IMPRESSION: No ac radiographic evidence for active cardiopulmonary disease.

## 2021-03-26 ENCOUNTER — Emergency Department (HOSPITAL_COMMUNITY)
Admission: EM | Admit: 2021-03-26 | Discharge: 2021-03-26 | Disposition: A | Discharge status: Home / Self Care | Payer: Commercial Managed Care - PPO | Attending: Emergency Medicine | Admitting: Emergency Medicine

## 2021-03-26 ENCOUNTER — Other Ambulatory Visit: Payer: Self-pay

## 2021-03-26 ENCOUNTER — Emergency Department (HOSPITAL_COMMUNITY): Payer: Commercial Managed Care - PPO

## 2021-03-26 ENCOUNTER — Encounter (HOSPITAL_COMMUNITY): Payer: Self-pay

## 2021-03-26 DIAGNOSIS — R1011 Right upper quadrant pain: Secondary | ICD-10-CM | POA: Diagnosis not present

## 2021-03-26 LAB — COMPREHENSIVE METABOLIC PANEL
ALT: 16 U/L (ref 0–44)
AST: 16 U/L (ref 15–41)
Albumin: 4.4 g/dL (ref 3.5–5.0)
Alkaline Phosphatase: 54 U/L (ref 38–126)
Anion gap: 7 (ref 5–15)
BUN: 12 mg/dL (ref 6–20)
CO2: 26 mmol/L (ref 22–32)
Calcium: 9 mg/dL (ref 8.9–10.3)
Chloride: 105 mmol/L (ref 98–111)
Creatinine, Ser: 0.85 mg/dL (ref 0.44–1.00)
GFR, Estimated: >60 mL/min (ref >60)
Glucose, Bld: 103 mg/dL — ABNORMAL HIGH (ref 70–99)
Potassium: 4 mmol/L (ref 3.5–5.1)
Sodium: 138 mmol/L (ref 135–145)
Total Bilirubin: 0.7 mg/dL (ref 0.3–1.2)
Total Protein: 7.5 g/dL (ref 6.5–8.1)

## 2021-03-26 LAB — URINALYSIS, ROUTINE W REFLEX MICROSCOPIC
Bilirubin Urine: NEGATIVE
Glucose, UA: NEGATIVE mg/dL
Hgb urine dipstick: NEGATIVE
Ketones, ur: NEGATIVE mg/dL
Leukocytes,Ua: NEGATIVE
Nitrite: NEGATIVE
Protein, ur: NEGATIVE mg/dL
Specific Gravity, Urine: 1.014 (ref 1.005–1.030)
pH: 7 (ref 5.0–8.0)

## 2021-03-26 LAB — LIPASE, BLOOD: Lipase: 30 U/L (ref 11–51)

## 2021-03-26 LAB — CBC
HCT: 40.9 % (ref 36.0–46.0)
Hemoglobin: 12.9 g/dL (ref 12.0–15.0)
MCH: 26.2 pg (ref 26.0–34.0)
MCHC: 31.5 g/dL (ref 30.0–36.0)
MCV: 83.1 fL (ref 80.0–100.0)
Platelets: 235 10*3/uL (ref 150–400)
RBC: 4.92 MIL/uL (ref 3.87–5.11)
RDW: 14.8 % (ref 11.5–15.5)
WBC: 4.1 10*3/uL (ref 4.0–10.5)
nRBC: 0 % (ref 0.0–0.2)

## 2021-03-26 MED ORDER — IBUPROFEN 200 MG PO TABS: 200.0000 mg | ORAL_TABLET | Freq: Four times a day (QID) | ORAL | Status: DC | PRN

## 2021-03-26 MED ORDER — ONDANSETRON HCL 4 MG PO TABS
4.0000 mg | ORAL_TABLET | ORAL | Status: DC | PRN
  Filled 2021-03-26: qty 1

## 2021-03-26 MED ORDER — SUCRALFATE 1 G PO TABS: 1.0000 g | ORAL_TABLET | Freq: Three times a day (TID) | ORAL | 1 refills | Status: AC

## 2021-03-26 NOTE — ED Provider Notes (Addendum)
[facility]-EMERGENCY DEPT Provider Note   CSN: [id number] Arrival date & time: 03/26/21  5638    History Chief Complaint  Patient presents with   Abdominal Pain    Alexandra Owens is a 60 y.o. female who presents to the ED today with complaints of RUQ/epigastric abdominal pain.  She is unsure exactly when all of her symptoms started, however she noticed worsening RUQ pain over the past several days to week.  The pain first started after eating foods, but now she states that the pain is constant and feels like a "pressing" pain.  The pain does not radiate anywhere however she does describe feeling a shooting pain to the right shoulder and down her right leg since this all started.  She states that she felt nauseous this morning, but she has not vomited.  States that she may be constipated and has been having some smaller-formed stools.  Has not eaten recently, describes feeling "full." She has not taken Tylenol or ibuprofen at home.  Of note, patient was seen in the ED 2 years ago and was found to have diverticulitis.    History reviewed. No pertinent past medical history.  There are no problems to display for this patient.   Past Surgical History:  Procedure Laterality Date   ABDOMINAL HYSTERECTOMY       OB History   No obstetric history on file.     History reviewed. No pertinent family history.  Social History   Tobacco Use   Smoking status: Never  Substance Use Topics   Alcohol use: Yes    Alcohol/week: 0.0 standard drinks    Comment: rarely   Drug use: Never    Home Medications Prior to Admission medications   Medication Sig Start Date End Date Taking? Authorizing Provider  sucralfate (CARAFATE) 1 g tablet Take 1 tablet (1 g total) by mouth 4 (four) times daily -  with meals and at bedtime. 03/26/21  Yes Andrey Campanile, MD  amoxicillin-clavulanate (AUGMENTIN) 875-125 MG tablet Take 1 tablet by mouth 2 (two) times daily. One po bid x 7 days  05/05/18   Molpus, John, MD    Allergies    Sulfa antibiotics  Review of Systems   Review of Systems  Constitutional: Negative.   HENT: Negative.    Eyes: Negative.   Respiratory: Negative.  Negative for shortness of breath.   Cardiovascular: Negative.  Negative for chest pain.  Gastrointestinal:  Positive for abdominal pain, constipation and nausea. Negative for vomiting.  Endocrine: Negative.   Genitourinary: Negative.   Musculoskeletal:        Right shoulder pain described as shooting, going down her right leg.  Skin: Negative.   Allergic/Immunologic: Negative.   Neurological:  Negative for numbness.  Hematological: Negative.   Psychiatric/Behavioral: Negative.     Physical Exam Updated Vital Signs BP (!) 127/92   Pulse 74   Temp 98.1 F (36.7 C) (Oral)   Resp 16   SpO2 99%   Physical Exam Constitutional:      General: She is not in acute distress.    Appearance: She is well-developed.  HENT:     Head: Normocephalic and atraumatic.  Cardiovascular:     Rate and Rhythm: Normal rate and regular rhythm.     Heart sounds: No murmur heard.   No friction rub. No gallop.  Pulmonary:     Effort: Pulmonary effort is normal.     Breath sounds: Normal breath sounds. No wheezing, rhonchi or rales.  Abdominal:     General: Abdomen is flat.     Palpations: Abdomen is soft.     Tenderness: There is abdominal tenderness in the right upper quadrant and epigastric area. There is no guarding or rebound. Negative signs include Murphy's sign.  Skin:    General: Skin is warm and dry.  Neurological:     General: No focal deficit present.     Mental Status: She is alert and oriented to person, place, and time.  Psychiatric:        Mood and Affect: Mood normal.        Behavior: Behavior normal.    ED Results / Procedures / Treatments   Labs (all labs ordered are listed, but only abnormal results are displayed) Labs Reviewed  COMPREHENSIVE METABOLIC PANEL - Abnormal; Notable  for the following components:      Result Value   Glucose, Bld 103 (*)    All other components within normal limits  LIPASE, BLOOD  CBC  URINALYSIS, ROUTINE W REFLEX MICROSCOPIC    EKG None  Radiology US Abdomen Limited RUQ (LIVER/GB)  Result Date: 03/26/2021 CLINICAL DATA:  Abdominal pain EXAM: ULTRASOUND ABDOMEN LIMITED RIGHT UPPER QUADRANT COMPARISON:  Abdominal x-ray 05/11/2015 FINDINGS: Gallbladder: No gallstones or wall thickening visualized. No sonographic Murphy sign noted by sonographer. Common bile duct: Diameter: 5 Liver: Normal contour and parenchymal echogenicity. A few anechoic hepatic cysts identified measuring up to 3.2 x 2.4 cm. Portal vein is patent on color Doppler imaging with normal direction of blood flow towards the liver. Other: None. IMPRESSION: No acute process identified.  Hepatic cysts. Electronically Signed   By: Jannifer Hick M.D.   On: 03/26/2021 13:32    Procedures Procedures   Medications Ordered in ED Medications  ondansetron (ZOFRAN) tablet 4 mg (4 mg Oral Patient Refused/Not Given 03/26/21 1348)    ED Course  I have reviewed the triage vital signs and the nursing notes.  Pertinent labs & imaging results that were available during my care of the patient were reviewed by me and considered in my medical decision making (see chart for details).    MDM Rules/Calculators/A&P                          This is a 60 year old female who presented here with postprandial RUQ pain, now a constant abdominal pain.  Patient is afebrile and hemodynamically stable.  CMP, CBC, UA, lipase are all unremarkable.  Presentation is concerning for biliary colic, therefore will obtain RUQ ultrasound at this time.  Other items on differential include gastritis versus cholecystitis versus musculoskeletal etiology.  1:30 PM: Ultrasound showed no evidence of gallstones or gallbladder wall thickening.  Discussed with patient low suspicion for biliary colic at this time.   Symptoms could be reflective of acute gastritis.  Discussed that we could obtain CT scan for further evaluation or can discharge home with PPI and possible CAT scan in 2 weeks if the patient does not improve.  Patient would like to try PPIs and follow-up with her PCP if symptoms persist.  Patient was discharged home in stable condition and advised to return to the ED if she has significantly worsening abdominal pain, fevers, vomiting.   Final Clinical Impression(s) / ED Diagnoses Final diagnoses:  Postprandial RUQ pain  Right upper quadrant abdominal pain    Rx / DC Orders ED Discharge Orders          Ordered    sucralfate (CARAFATE)  1 g tablet  3 times daily with meals & bedtime        03/26/21 1352             Andrey Campanile, MD 03/26/21 1356    Andrey Campanile, MD 03/26/21 1357    Gwyneth Sprout, MD 03/27/21 1128

## 2021-03-26 NOTE — ED Triage Notes (Signed)
Pt c/o abdominal pain and nausea x 4 days. States the pain worsens after eating

## 2021-03-26 NOTE — ED Notes (Signed)
Patient has a urine culture in the main lab 

## 2021-03-26 NOTE — Discharge Instructions (Addendum)
You were seen in the ED today for abdominal pain.  We got an ultrasound to look at your gallbladder, and this did not show any gallstones or signs of an infected gallbladder.  Your labs today were also reassuring that there is not an infective process going on.  Your symptoms could be due to something called gastritis, which is inflammation of your stomach.  We we will give you a medication called sucralfate to take to help with the stomach acid.  Please take this 3 times a day for 1-2 months.  If, after 2 weeks, your symptoms do not improve, follow-up with your primary care provider as you may need to get a CAT scan like we discussed.  Please return to the ED for any severe symptoms such as significant abdominal pain, vomiting, fevers.

## 2021-10-05 ENCOUNTER — Ambulatory Visit
Admission: RE | Admit: 2021-10-05 | Discharge: 2021-10-05 | Disposition: A | Discharge status: Home / Self Care | Payer: Commercial Managed Care - PPO | Source: Ambulatory Visit | Attending: Sports Medicine | Admitting: Sports Medicine

## 2021-10-05 ENCOUNTER — Other Ambulatory Visit: Payer: Self-pay | Admitting: Sports Medicine

## 2021-10-05 DIAGNOSIS — M25561 Pain in right knee: Secondary | ICD-10-CM

## 2023-05-17 ENCOUNTER — Ambulatory Visit
Admission: RE | Admit: 2023-05-17 | Discharge: 2023-05-17 | Disposition: A | Discharge status: Home / Self Care | Payer: Commercial Managed Care - PPO | Source: Ambulatory Visit | Attending: Family Medicine | Admitting: Family Medicine

## 2023-05-17 ENCOUNTER — Other Ambulatory Visit: Payer: Self-pay | Admitting: Family Medicine

## 2023-05-17 DIAGNOSIS — R0789 Other chest pain: Secondary | ICD-10-CM

## 2024-03-07 ENCOUNTER — Ambulatory Visit (HOSPITAL_COMMUNITY)
Admission: RE | Admit: 2024-03-07 | Discharge: 2024-03-07 | Disposition: A | Discharge status: Home / Self Care | Source: Ambulatory Visit | Attending: Family Medicine | Admitting: Family Medicine

## 2024-03-07 ENCOUNTER — Encounter: Payer: Self-pay | Admitting: Family Medicine

## 2024-03-07 ENCOUNTER — Other Ambulatory Visit: Payer: Self-pay | Admitting: Family Medicine

## 2024-03-07 ENCOUNTER — Ambulatory Visit (HOSPITAL_BASED_OUTPATIENT_CLINIC_OR_DEPARTMENT_OTHER)

## 2024-03-07 DIAGNOSIS — R1032 Left lower quadrant pain: Secondary | ICD-10-CM | POA: Insufficient documentation

## 2024-03-13 ENCOUNTER — Other Ambulatory Visit (HOSPITAL_COMMUNITY): Payer: Self-pay | Admitting: Family Medicine

## 2024-03-13 ENCOUNTER — Ambulatory Visit (HOSPITAL_BASED_OUTPATIENT_CLINIC_OR_DEPARTMENT_OTHER)

## 2024-03-13 DIAGNOSIS — R1032 Left lower quadrant pain: Secondary | ICD-10-CM
